# Patient Record
Sex: Male | Born: 1960 | Race: Black or African American | Hispanic: No | Marital: Married | Smoking: Never smoker
Health system: Southern US, Community
[De-identification: ages and names within clinical notes are randomized; demographics above are authoritative.]

---

## 2016-10-01 ENCOUNTER — Encounter (HOSPITAL_BASED_OUTPATIENT_CLINIC_OR_DEPARTMENT_OTHER): Payer: Self-pay | Admitting: Emergency Medicine

## 2016-10-01 ENCOUNTER — Emergency Department (HOSPITAL_BASED_OUTPATIENT_CLINIC_OR_DEPARTMENT_OTHER)
Admission: EM | Admit: 2016-10-01 | Discharge: 2016-10-01 | Disposition: A | Discharge status: Home / Self Care | Payer: Self-pay | Attending: Emergency Medicine | Admitting: Emergency Medicine

## 2016-10-01 DIAGNOSIS — R0789 Other chest pain: Secondary | ICD-10-CM | POA: Insufficient documentation

## 2016-10-01 DIAGNOSIS — N183 Chronic kidney disease, stage 3 (moderate): Secondary | ICD-10-CM | POA: Insufficient documentation

## 2016-10-01 DIAGNOSIS — Z79899 Other long term (current) drug therapy: Secondary | ICD-10-CM | POA: Insufficient documentation

## 2016-10-01 DIAGNOSIS — J449 Chronic obstructive pulmonary disease, unspecified: Secondary | ICD-10-CM | POA: Insufficient documentation

## 2016-10-01 DIAGNOSIS — R21 Rash and other nonspecific skin eruption: Secondary | ICD-10-CM

## 2016-10-01 DIAGNOSIS — T7840XA Allergy, unspecified, initial encounter: Secondary | ICD-10-CM

## 2016-10-01 DIAGNOSIS — Z951 Presence of aortocoronary bypass graft: Secondary | ICD-10-CM | POA: Insufficient documentation

## 2016-10-01 DIAGNOSIS — Z7982 Long term (current) use of aspirin: Secondary | ICD-10-CM | POA: Insufficient documentation

## 2016-10-01 DIAGNOSIS — F17210 Nicotine dependence, cigarettes, uncomplicated: Secondary | ICD-10-CM | POA: Insufficient documentation

## 2016-10-01 DIAGNOSIS — I2510 Atherosclerotic heart disease of native coronary artery without angina pectoris: Secondary | ICD-10-CM | POA: Insufficient documentation

## 2016-10-01 DIAGNOSIS — E1122 Type 2 diabetes mellitus with diabetic chronic kidney disease: Secondary | ICD-10-CM | POA: Insufficient documentation

## 2016-10-01 DIAGNOSIS — I5042 Chronic combined systolic (congestive) and diastolic (congestive) heart failure: Secondary | ICD-10-CM | POA: Insufficient documentation

## 2016-10-01 DIAGNOSIS — I130 Hypertensive heart and chronic kidney disease with heart failure and stage 1 through stage 4 chronic kidney disease, or unspecified chronic kidney disease: Secondary | ICD-10-CM | POA: Insufficient documentation

## 2016-10-01 DIAGNOSIS — Z794 Long term (current) use of insulin: Secondary | ICD-10-CM | POA: Insufficient documentation

## 2016-10-01 MED ORDER — PREDNISONE 20 MG PO TABS: 20.0000 mg | ORAL_TABLET | Freq: Every day | ORAL | 0 refills | Status: AC

## 2016-10-01 MED ORDER — FAMOTIDINE 40 MG PO TABS: 40.0000 mg | ORAL_TABLET | Freq: Every day | ORAL | 0 refills | Status: AC

## 2016-10-01 MED ORDER — EPINEPHRINE 0.3 MG/0.3ML IJ SOAJ: 0.3000 mg | Freq: Once | INTRAMUSCULAR | 0 refills | Status: AC

## 2016-10-01 MED FILL — FAMOTIDINE 40 MG TABLET: 40 | 14 days supply | Qty: 14 | Fill #0

## 2016-10-01 MED FILL — predniSONE 20 MG TABS: 20 | 5 days supply | Qty: 5 | Fill #0

## 2016-10-01 NOTE — ED Triage Notes (Signed)
Patient reports he feels like he feels like he is having an allergic reaction due to "certain things I eat I start itching".  Reports rash left arm after eating chocolate, milk, or peanut butter.  States that he is unsure what is causing his allergic reactions.  Patient denies shortness of breath, difficulty speaking. In NAD

## 2016-10-01 NOTE — ED Provider Notes (Signed)
Emergency Department Provider Note   I have reviewed the triage vital signs and the nursing notes.   HISTORY  Chief Complaint Allergic Reaction   HPI James Carr is a 56 y.o. male presents to the emergency department for evaluation of itching and palpable rash after eating certain foods. 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 He states after eating these foods he develops itching over his entire body and a palpable rash over his arms, legs, back and chest. No shortness of breath, tongue swelling, lip swelling. 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 Does not carry EpiPen.    History reviewed. No pertinent past medical history.  There are no active problems to display for this patient.   History reviewed. No pertinent surgical history.  Current Outpatient Rx  . Order #: [id number] Class: Print  . Order #: [id number] Class: Print  . Order #: [id number] Class: Print    Allergies Bee venom and Shellfish allergy  History reviewed. No pertinent family history.  Social History Social History  Substance Use Topics  . Smoking status: Never Smoker  . Smokeless tobacco: Never Used  . Alcohol use Yes     Comment: occ    Review of Systems  Constitutional: No fever/chills Eyes: No visual changes. ENT: No sore throat. Cardiovascular: Denies chest pain. Respiratory: Denies shortness of breath. Gastrointestinal: No abdominal pain.  No nausea, no vomiting.  No diarrhea.  No constipation. Genitourinary: Negative for dysuria. Musculoskeletal: Negative for back pain. Skin: Positive for rash and itching after eating certain foods.  Neurological: Negative for headaches, focal weakness or numbness.  10-point ROS otherwise negative.  ____________________________________________   PHYSICAL EXAM:  VITAL SIGNS: ED Triage Vitals  Enc Vitals Group     BP 10/01/16 1330  138/88     Pulse Rate 10/01/16 1330 79     Resp 10/01/16 1330 16     Temp 10/01/16 1330 98.4 F (36.9 C)     Temp Source 10/01/16 1330 Oral     SpO2 10/01/16 1330 99 %     Weight 10/01/16 1331 205 lb (93 kg)     Height 10/01/16 1331  (1.854 m)   Constitutional: Alert and oriented. Well appearing and in no acute distress. Eyes: Conjunctivae are normal. Head: Atraumatic. Nose: No congestion/rhinnorhea. Mouth/Throat: Mucous membranes are moist.  Oropharynx non-erythematous. Neck: No stridor.   Cardiovascular: Normal rate, regular rhythm. Good peripheral circulation. Grossly normal heart sounds.   Respiratory: Normal respiratory effort.  No retractions. Lungs CTAB. Gastrointestinal: Soft and nontender. No distention.  Musculoskeletal: No lower extremity tenderness nor edema. No gross deformities of extremities. Neurologic:  Normal speech and language. No gross focal neurologic deficits are appreciated.  Skin:  Skin is warm, dry and intact. Palpable faint over left arm and trunk.   ____________________________________________   PROCEDURES  Procedure(s) performed:   Procedures  None ____________________________________________   INITIAL IMPRESSION / ASSESSMENT AND PLAN / ED COURSE  Pertinent labs & imaging results that were available during my care of the patient were reviewed by me and considered in my medical decision making (see chart for details).  Patient resents emergency department for evaluation of itching and rash that developed after eating certain foods. Patient unable to pinpoint exactly which food he is reacting to. No breathing or throat swelling symptoms. Plan for steroids, pepcid, Benadryl PRN, and EpiPen to carry with him. Discussed PCP  follow up and referral from there to an allergist PRN. Discussed use of the EpiPen in detail.   At this time, I do not feel there is any life-threatening condition present. I have reviewed and discussed all results (EKG,  imaging, lab, urine as appropriate), exam findings with patient. I have reviewed nursing notes and appropriate previous records.  I feel the patient is safe to be discharged home without further emergent workup. Discussed usual and customary return precautions. Patient and family (if present) verbalize understanding and are comfortable with this plan.  Patient will follow-up with their primary care provider. If they do not have a primary care provider, information for follow-up has been provided to them. All questions have been answered.  ____________________________________________  FINAL CLINICAL IMPRESSION(S) / ED DIAGNOSES  Final diagnoses:  Allergic reaction, initial encounter  Rash     MEDICATIONS GIVEN DURING THIS VISIT:  Medications - No data to display   NEW OUTPATIENT MEDICATIONS STARTED DURING THIS VISIT:  Discharge Medication List as of 10/01/2016  1:56 PM    START taking these medications   Details  EPINEPHrine (EPIPEN 2-PAK) 0.3 mg/0.3 mL IJ SOAJ injection Inject 0.3 mLs (0.3 mg total) into the muscle once., Starting Wed 10/01/2016, Print    famotidine (PEPCID) 40 MG tablet Take 1 tablet (40 mg total) by mouth daily., Starting Wed 10/01/2016, Until Wed 10/15/2016, Print    predniSONE (DELTASONE) 20 MG tablet Take 1 tablet (20 mg total) by mouth daily., Starting Wed 10/01/2016, Until Mon 10/06/2016, Print        Note:  This document was prepared using Dragon voice recognition software and may include unintentional dictation errors.  Alona Bene, MD Emergency Medicine    [place] Guiles, Arlyss Repress, MD 10/01/16 [phone]

## 2016-10-01 NOTE — Discharge Instructions (Signed)

## 2017-01-14 ENCOUNTER — Ambulatory Visit (INDEPENDENT_AMBULATORY_CARE_PROVIDER_SITE_OTHER): Payer: Self-pay

## 2017-01-14 ENCOUNTER — Other Ambulatory Visit: Payer: Self-pay | Admitting: Gerontology

## 2017-01-14 DIAGNOSIS — R52 Pain, unspecified: Secondary | ICD-10-CM

## 2017-01-14 DIAGNOSIS — M546 Pain in thoracic spine: Secondary | ICD-10-CM

## 2017-01-14 DIAGNOSIS — M545 Low back pain: Secondary | ICD-10-CM

## 2018-06-01 IMAGING — DX DG THORACIC SPINE 2V
3 series · 3 of 3 positions shown · non-contrast
Comparison: None.

CLINICAL DATA: Mid back pain since 01/07/2017.

EXAM:
THORACIC SPINE 2 VIEWS

[t-spine ap]
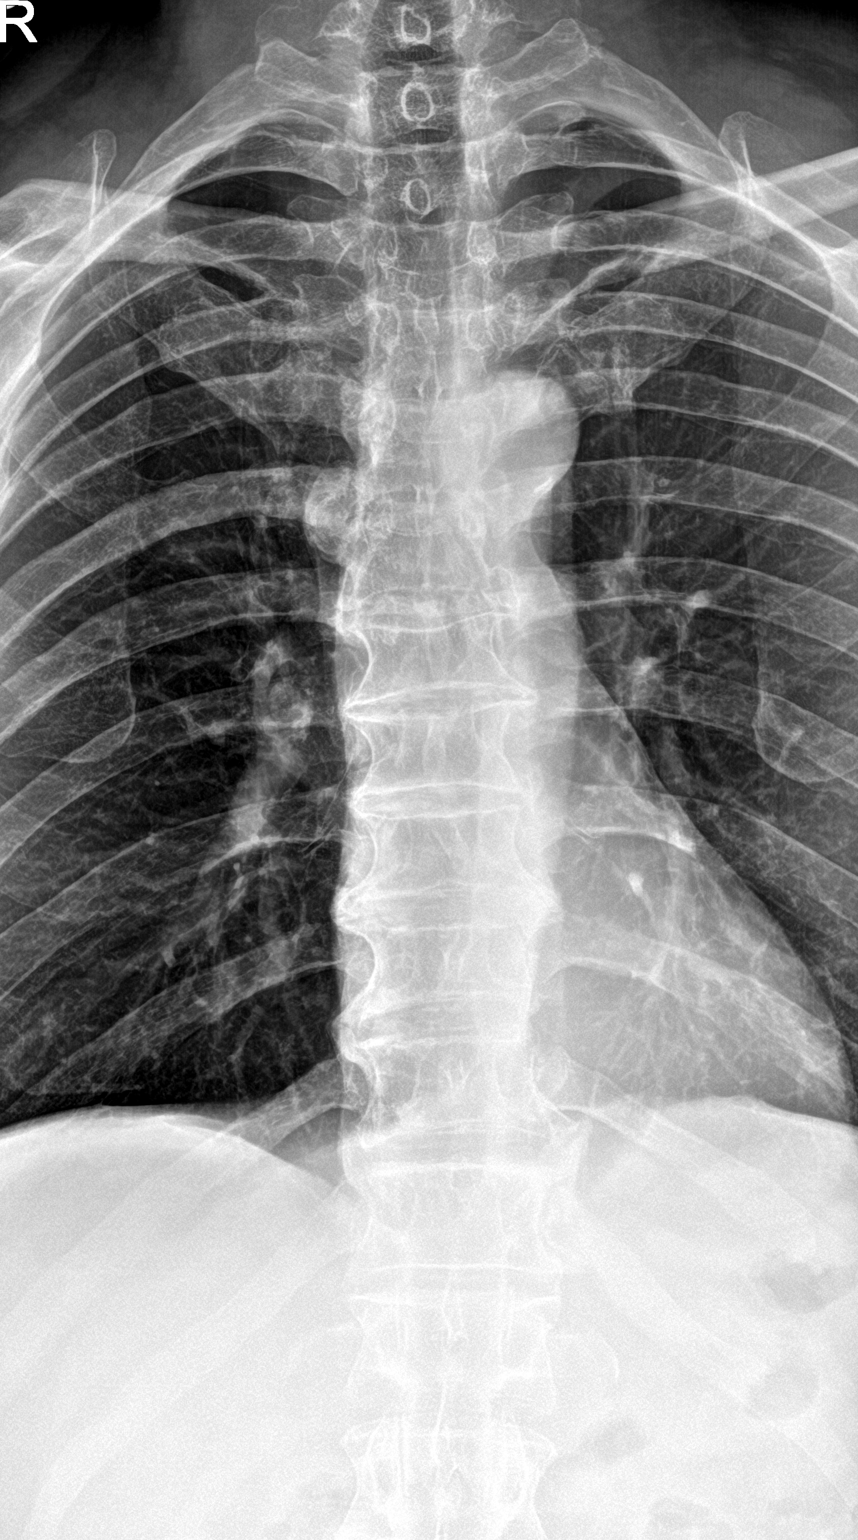

[t-spine lat]
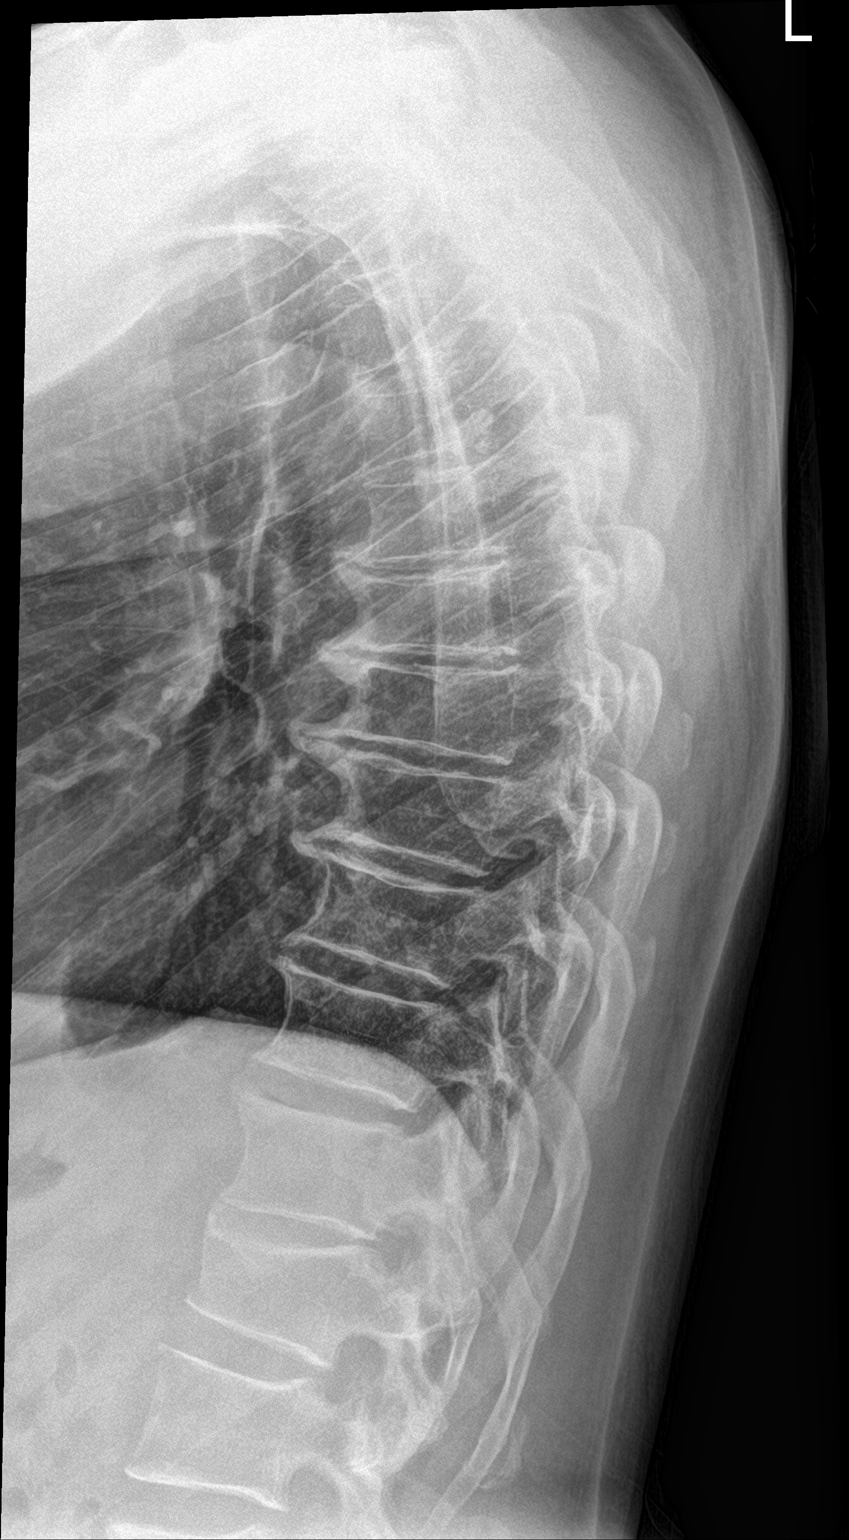

[t-spine swimmers]
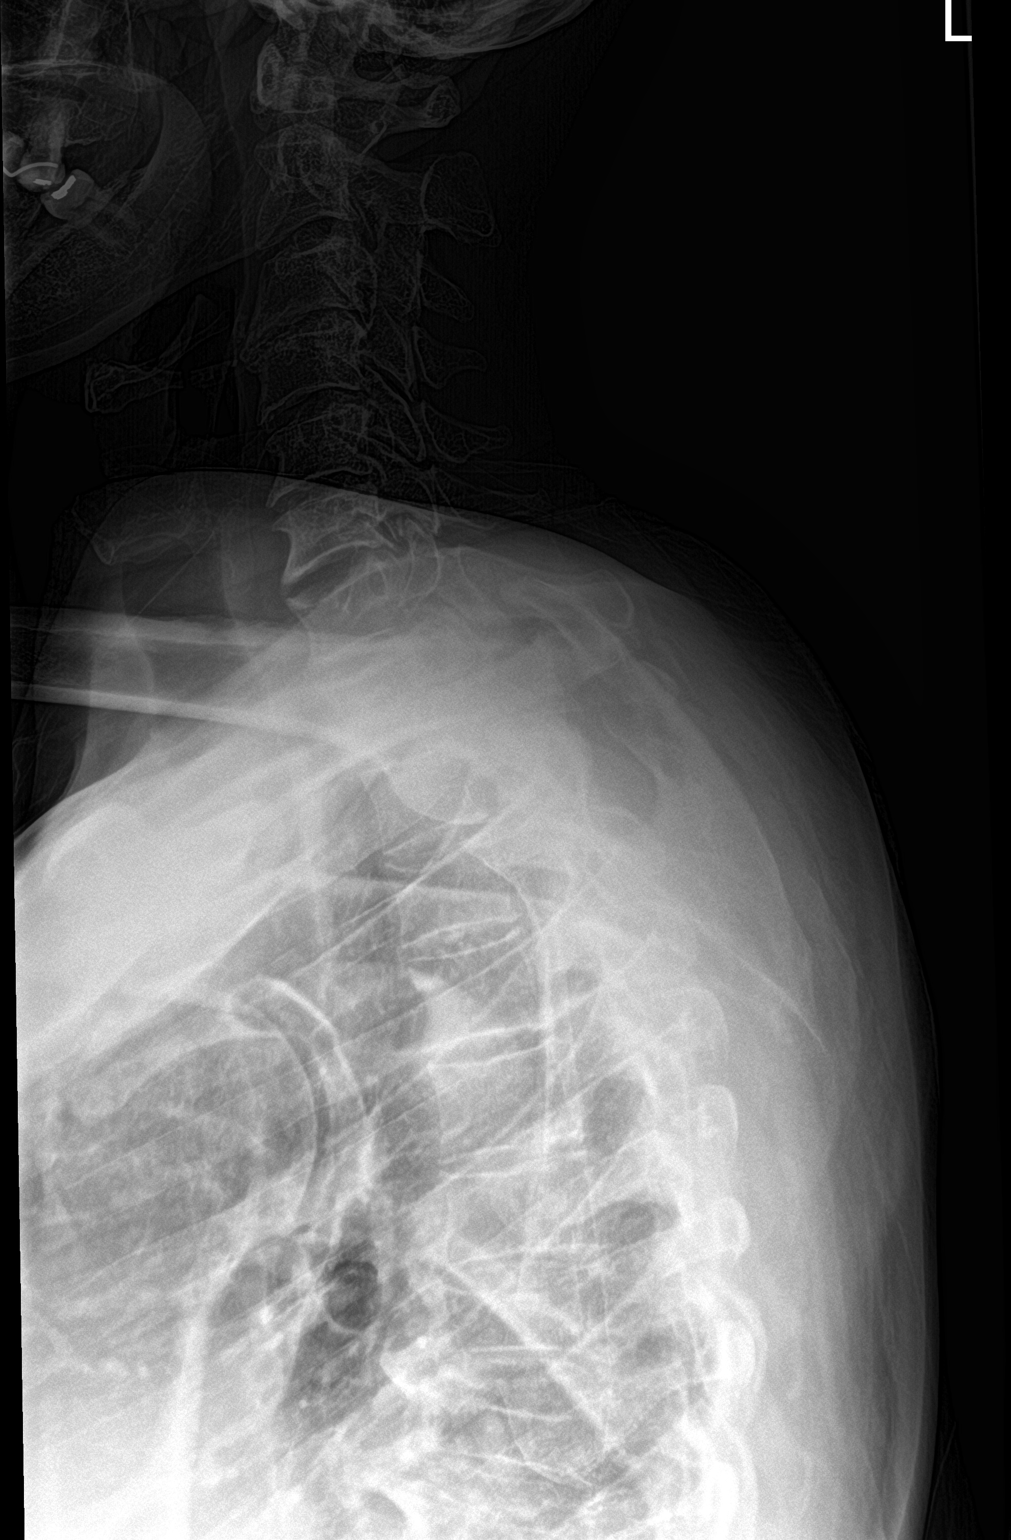

[3 of 3 positions shown; findings below may reference images not displayed]

FINDINGS: Moderate degenerative changes involving the midthoracic spine with
large marginal bridging osteophytes. The overall alignment is
normal. No acute bony findings or abnormal paraspinal soft tissue
swelling. The visualized lungs are clear.
IMPRESSION: Moderate midthoracic degenerative changes for age. No acute bony
findings.
